# Patient Record
Sex: Female | Born: 1985 | Hispanic: Yes
Health system: Southern US, Community
[De-identification: ages and names within clinical notes are randomized; demographics above are authoritative.]

---

## 2015-11-23 ENCOUNTER — Other Ambulatory Visit: Payer: Self-pay | Admitting: Advanced Practice Midwife

## 2015-11-23 DIAGNOSIS — Z3482 Encounter for supervision of other normal pregnancy, second trimester: Secondary | ICD-10-CM

## 2015-11-27 ENCOUNTER — Ambulatory Visit
Admission: RE | Admit: 2015-11-27 | Discharge: 2015-11-27 | Disposition: A | Discharge status: Home / Self Care | Payer: Self-pay | Source: Ambulatory Visit | Attending: Advanced Practice Midwife | Admitting: Advanced Practice Midwife

## 2015-11-27 ENCOUNTER — Other Ambulatory Visit: Payer: Self-pay | Admitting: Advanced Practice Midwife

## 2015-11-27 DIAGNOSIS — Z3482 Encounter for supervision of other normal pregnancy, second trimester: Secondary | ICD-10-CM

## 2015-11-27 DIAGNOSIS — Z36 Encounter for antenatal screening of mother: Secondary | ICD-10-CM | POA: Insufficient documentation

## 2015-11-27 DIAGNOSIS — O26842 Uterine size-date discrepancy, second trimester: Secondary | ICD-10-CM

## 2015-11-27 DIAGNOSIS — O321XX0 Maternal care for breech presentation, not applicable or unspecified: Secondary | ICD-10-CM | POA: Insufficient documentation

## 2015-11-27 DIAGNOSIS — Z3A22 22 weeks gestation of pregnancy: Secondary | ICD-10-CM | POA: Insufficient documentation

## 2016-08-03 IMAGING — US US OB FOLLOW-UP
1 series · 13 of 28 positions shown · non-contrast
Comparison: none

CLINICAL DATA: Size greater than dates.

EXAM:
OBSTETRIC 14+ WK ULTRASOUND FOLLOW-UP

[Series 1: us ob follow-up · 0.23mm/px · 13 of 103 slices shown]
[im 4/103]
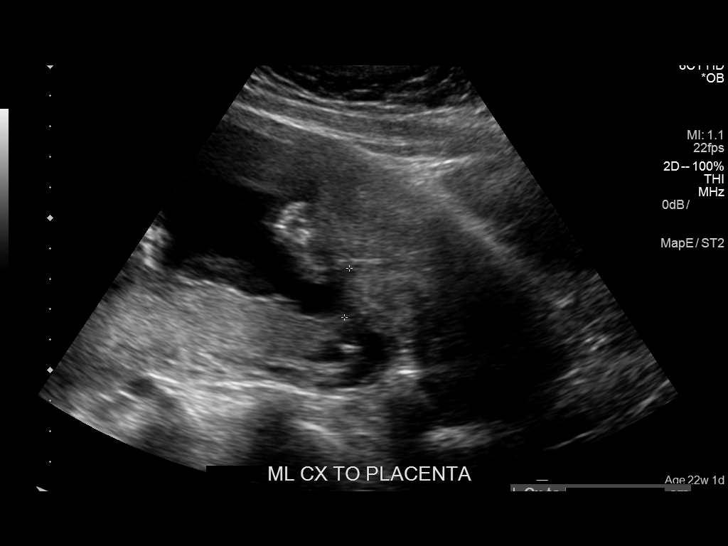
[im 12/103]
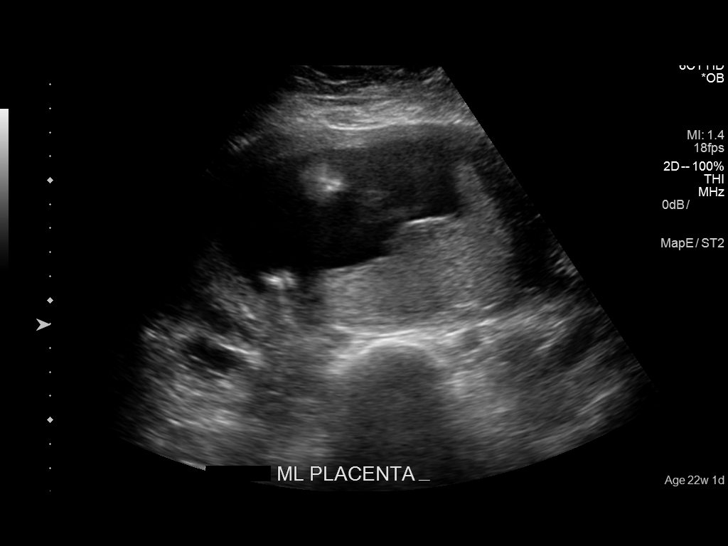
[im 19/103]
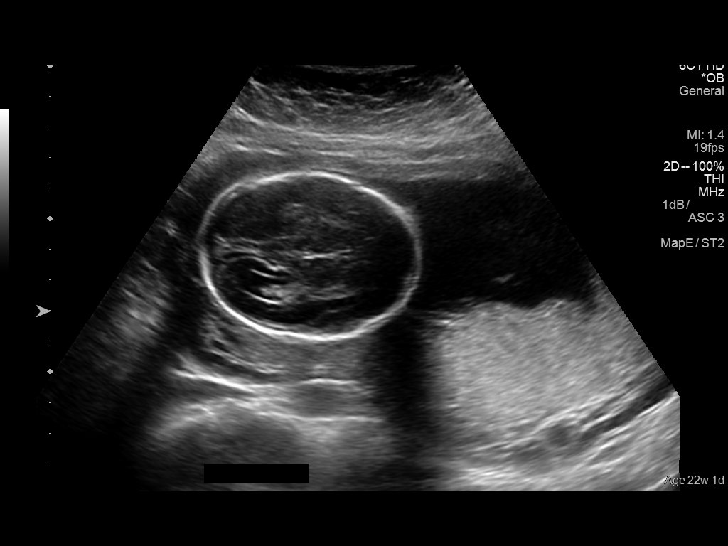
[im 27/103]
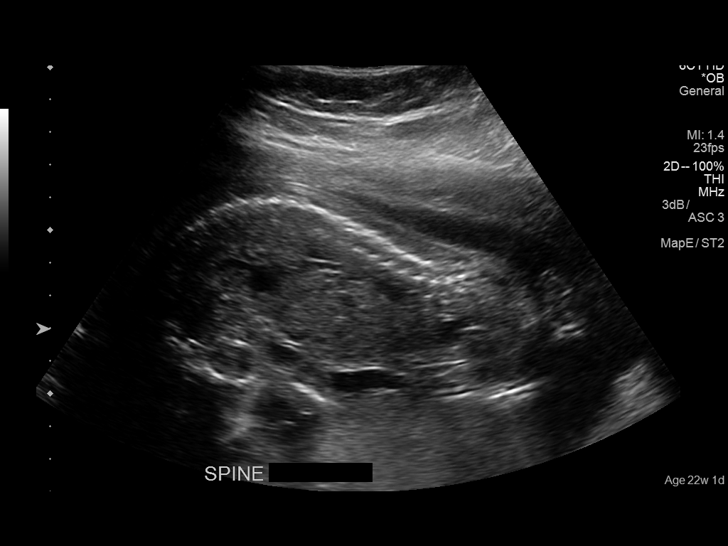
[im 35/103]
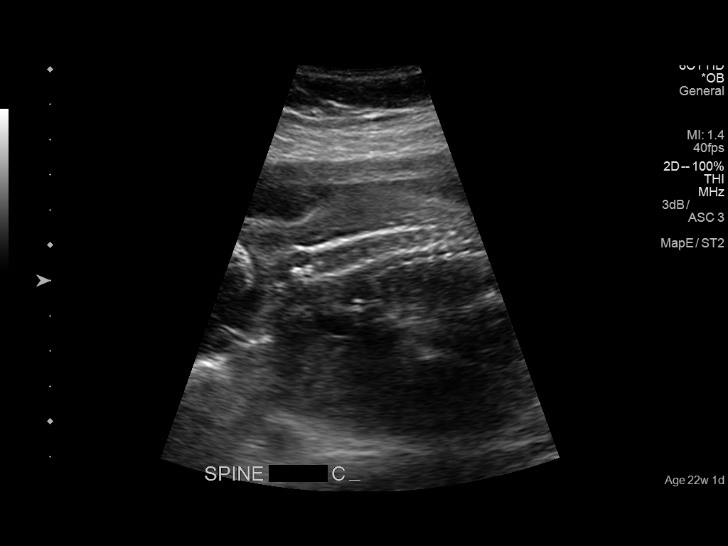
[im 42/103]
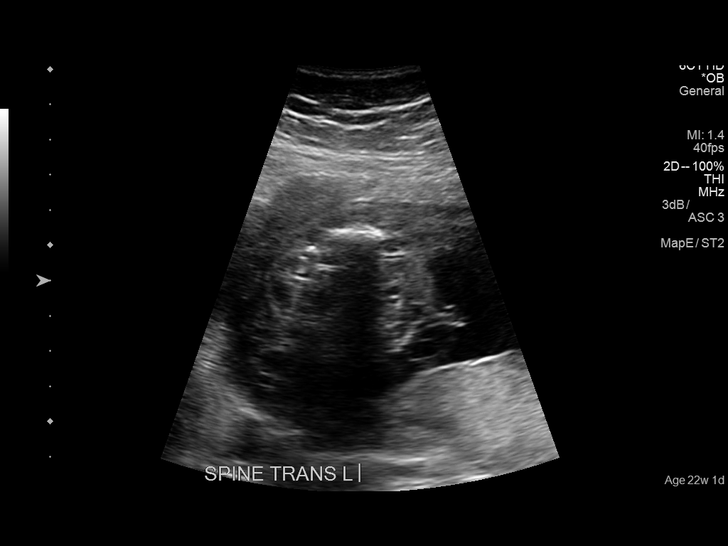
[im 53/103]
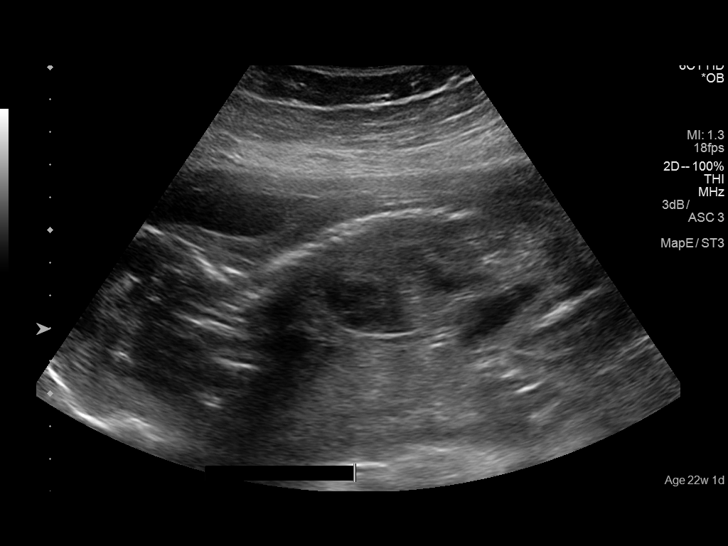
[im 61/103]
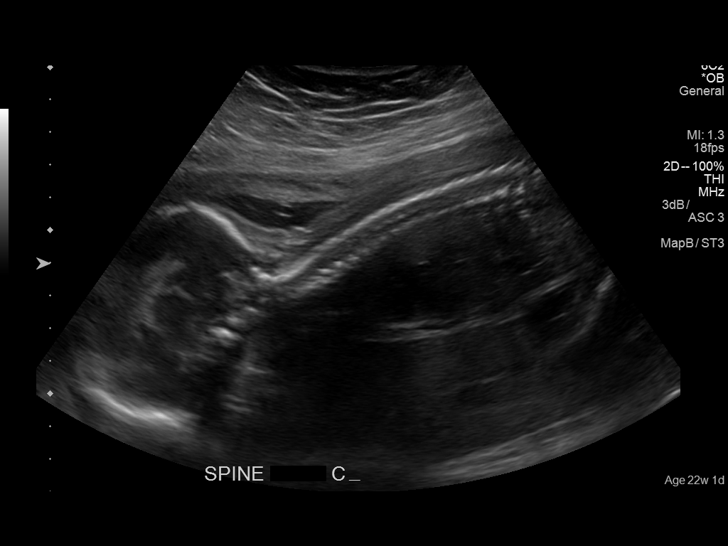
[im 69/103]
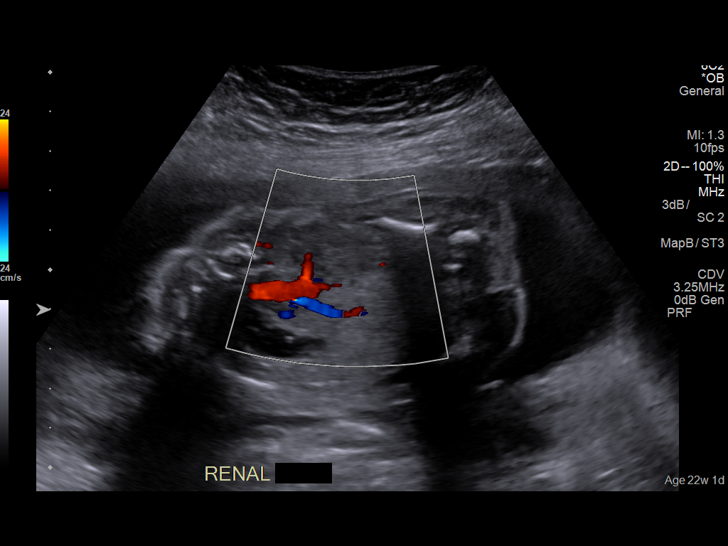
[im 76/103]
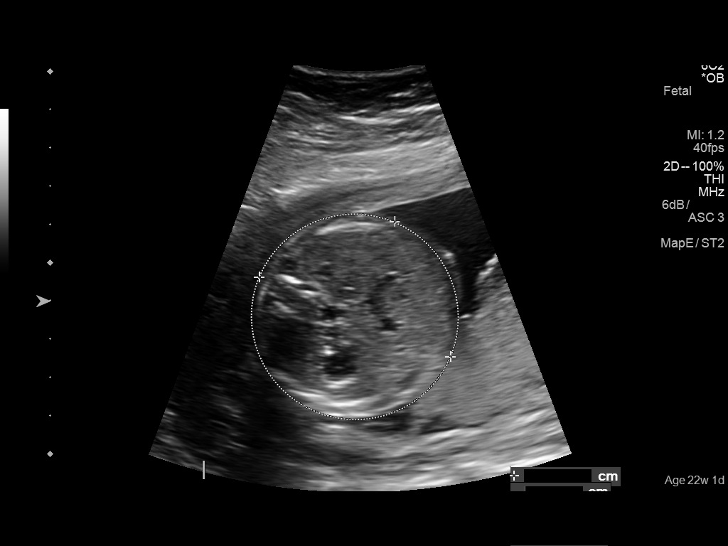
[im 84/103]
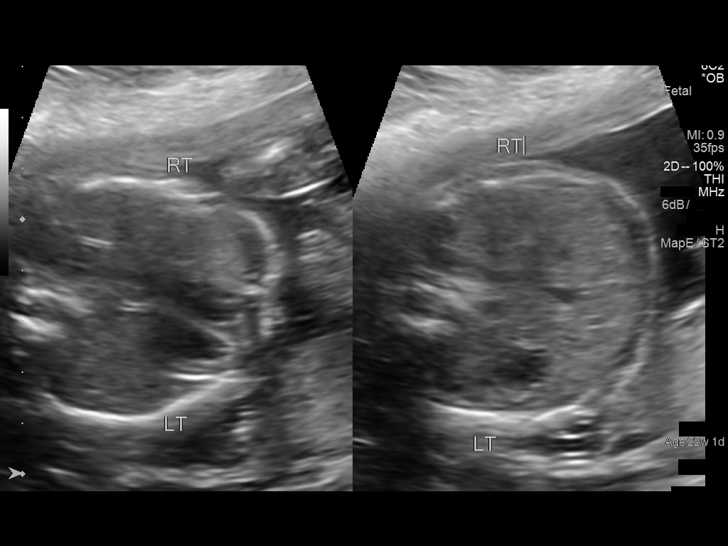
[im 91/103]
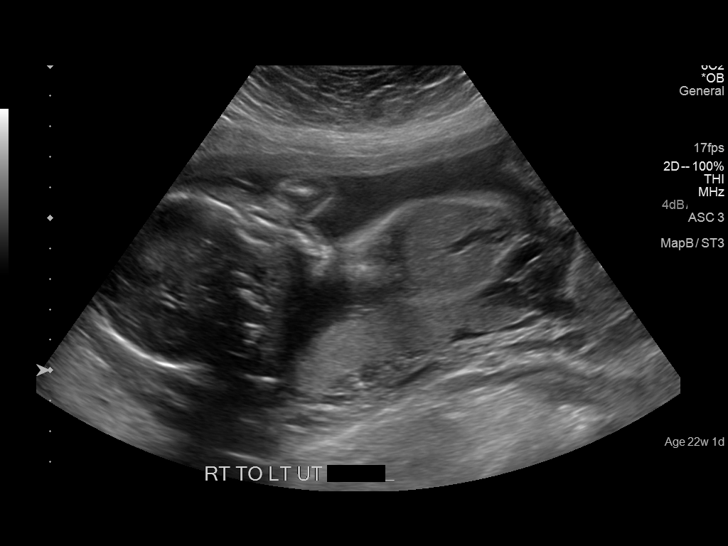
[im 99/103]
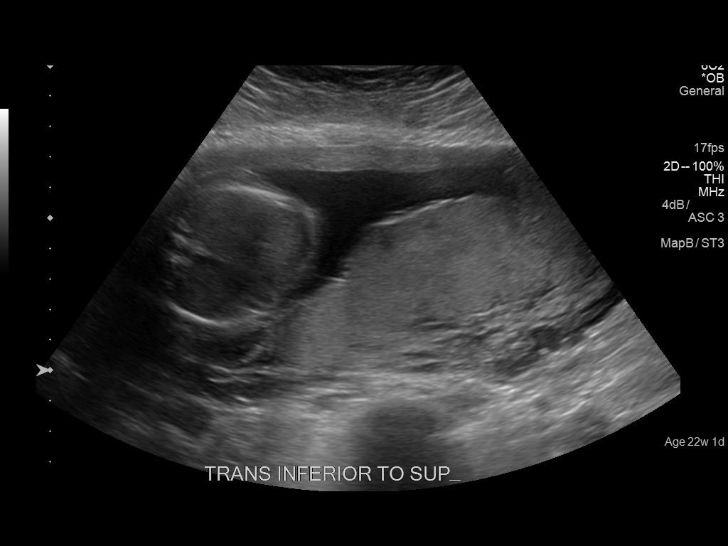

[13 of 28 positions shown; findings below may reference images not displayed]

FINDINGS: Number of Fetuses: 1

Heart Rate:  133 bpm

Movement: Present

Presentation: Breech

Previa: None

Placental Location: Posterior

Amniotic Fluid (Subjective): Normal

Amniotic Fluid (Objective):

Vertical pocket 4.8cm

FETAL BIOMETRY

BPD:  5.2cm 21w   6d

HC:    19.9cm  22w   0d

AC:   17.0cm  22w   0d

FL:   3.8cm  22w   1d

Current Mean GA: 22w 1d          US EDC: 03/31/2016

Assigned GA:  22w 1d              Assigned EDC:  03/31/2016

FETAL ANATOMY

Lateral Ventricles: Visualized

Thalami/CSP: Visualized

Posterior Fossa:  Visualized

Nuchal Region: Visualized    NFT= 3.0mm

Upper Lip: Visualized

Spine: Visualized

4 Chamber Heart on Left: Visualized

LVOT: Visualized

RVOT: Visualized

Stomach on Left: Visualized

3 Vessel Cord: Visualized

Cord Insertion site: Visualized

Kidneys: Visualized

Bladder: Visualized

Extremities: Visualized

Sex: Male

Technically difficult due to: Not applicable

MATERNAL FINDINGS:

Cervix: 5.6 cm
IMPRESSION: 1. Single living intrauterine fetus in breech presentation.
2. Size and dates correlate well.
3. Amniotic fluid volume is subjectively and objectively normal.
4. No fetal anomalies are identified.

## 2020-02-18 ENCOUNTER — Ambulatory Visit: Payer: Self-pay | Attending: Internal Medicine

## 2020-02-18 DIAGNOSIS — Z23 Encounter for immunization: Secondary | ICD-10-CM

## 2020-02-18 NOTE — Progress Notes (Signed)
   Covid-19 Vaccination Clinic  Name:  Lyric Rossano    MRN: [id number] DOB: [birth year]  02/18/2020  Ms. Velez-Martinez was observed post Covid-19 immunization for 15 minutes without incident. She was provided with Vaccine Information Sheet and instruction to access the V-Safe system.   Ms. Fayson was instructed to call 911 with any severe reactions post vaccine: Marland Kitchen Difficulty breathing  . Swelling of face and throat  . A fast heartbeat  . A bad rash all over body  . Dizziness and weakness   Immunizations Administered    Name Date Dose VIS Date Route   Pfizer COVID-19 Vaccine 02/18/2020  1:23 PM 0.3 mL 11/04/2019 Intramuscular   Manufacturer: [employer], [employer]   Lot: [id number]   NDC: [address]-7

## 2020-03-10 ENCOUNTER — Ambulatory Visit: Payer: Self-pay | Attending: Internal Medicine

## 2020-03-10 DIAGNOSIS — Z23 Encounter for immunization: Secondary | ICD-10-CM

## 2020-03-10 NOTE — Progress Notes (Signed)
   Covid-19 Vaccination Clinic  Name:  Myya Meenach    MRN: [id number] DOB: 10/15/1986  03/10/2020  Ms. Velez-Martinez was observed post Covid-19 immunization for 15 minutes without incident. She was provided with Vaccine Information Sheet and instruction to access the V-Safe system.   Ms. Savarino was instructed to call 911 with any severe reactions post vaccine: Marland Kitchen Difficulty breathing  . Swelling of face and throat  . A fast heartbeat  . A bad rash all over body  . Dizziness and weakness   Immunizations Administered    Name Date Dose VIS Date Route   Pfizer COVID-19 Vaccine 03/10/2020 12:43 PM 0.3 mL 11/04/2019 Intramuscular   Manufacturer: [employer], [employer]   Lot: [id number]   NDC: [address]-1

## 2024-03-02 ENCOUNTER — Other Ambulatory Visit: Payer: Self-pay | Admitting: Family Medicine

## 2024-03-02 DIAGNOSIS — R102 Pelvic and perineal pain: Secondary | ICD-10-CM

## 2024-03-16 ENCOUNTER — Ambulatory Visit
Admission: RE | Admit: 2024-03-16 | Discharge: 2024-03-16 | Disposition: A | Discharge status: Home / Self Care | Source: Ambulatory Visit | Attending: Family Medicine | Admitting: Family Medicine

## 2024-03-16 DIAGNOSIS — R102 Pelvic and perineal pain: Secondary | ICD-10-CM | POA: Insufficient documentation
